# Patient Record
Sex: Male | Born: 1985 | Race: White | Hispanic: No | Marital: Married | Smoking: Never smoker
Health system: Southern US, Community
[De-identification: ages and names within clinical notes are randomized; demographics above are authoritative.]

---

## 2017-09-10 ENCOUNTER — Emergency Department
Admission: EM | Admit: 2017-09-10 | Discharge: 2017-09-10 | Disposition: A | Discharge status: Home / Self Care | Payer: Self-pay | Source: Home / Self Care | Attending: Family Medicine | Admitting: Family Medicine

## 2017-09-10 ENCOUNTER — Encounter: Payer: Self-pay | Admitting: Emergency Medicine

## 2017-09-10 ENCOUNTER — Emergency Department (INDEPENDENT_AMBULATORY_CARE_PROVIDER_SITE_OTHER): Payer: Self-pay

## 2017-09-10 DIAGNOSIS — M5136 Other intervertebral disc degeneration, lumbar region: Secondary | ICD-10-CM

## 2017-09-10 DIAGNOSIS — M545 Low back pain, unspecified: Secondary | ICD-10-CM

## 2017-09-10 DIAGNOSIS — M48061 Spinal stenosis, lumbar region without neurogenic claudication: Secondary | ICD-10-CM

## 2017-09-10 MED ORDER — TRAMADOL HCL 50 MG PO TABS: 50.0000 mg | ORAL_TABLET | Freq: Four times a day (QID) | ORAL | 0 refills | Status: AC | PRN

## 2017-09-10 MED ORDER — MELOXICAM 15 MG PO TABS: 15.0000 mg | ORAL_TABLET | Freq: Every day | ORAL | 0 refills | Status: AC

## 2017-09-10 MED ORDER — METHOCARBAMOL 500 MG PO TABS
500.0000 mg | ORAL_TABLET | Freq: Two times a day (BID) | ORAL | 0 refills | Status: AC
Start: 2017-09-10 — End: ?

## 2017-09-10 NOTE — ED Provider Notes (Signed)
[facility]    CSN: [id number] Arrival date & time: 09/10/17  1335     History   Chief Complaint Chief Complaint  Patient presents with  . Back Pain    HPI Berk Pilot is a 32 y.o. male.   HPI  Quavion Boule is a 32 y.o. male presenting to UC with c/o 5 months lower back pain on Right side. No known injury. Pain has worsened over the last 3 days but more severe today after twisting in a chair.  Pain became severe and his back "locked up."  Pain is moderate in severity at this time, aching and sharp at times. No radiation of pain or numbness into his legs or up his back.  Denies heavy lifting or falls. No urinary symptoms. No hx of kidney stones. He has taken tylenol on occasion with mild relief.    History reviewed. No pertinent past medical history.  There are no active problems to display for this patient.   History reviewed. No pertinent surgical history.     Home Medications    Prior to Admission medications   Medication Sig Start Date End Date Taking? Authorizing Provider  meloxicam (MOBIC) 15 MG tablet Take 1 tablet (15 mg total) by mouth daily. 09/10/17   Lurene Shadow, PA-C  methocarbamol (ROBAXIN) 500 MG tablet Take 1 tablet (500 mg total) by mouth 2 (two) times daily. 09/10/17   Lurene Shadow, PA-C  traMADol (ULTRAM) 50 MG tablet Take 1 tablet (50 mg total) by mouth every 6 (six) hours as needed. 09/10/17   Lurene Shadow, PA-C    Family History History reviewed. No pertinent family history.  Social History Social History   Tobacco Use  . Smoking status: Never Smoker  . Smokeless tobacco: Never Used  Substance Use Topics  . Alcohol use: Not Currently  . Drug use: Not on file     Allergies   Patient has no allergy information on record.   Review of Systems Review of Systems  Gastrointestinal: Negative for abdominal pain, nausea and vomiting.  Genitourinary: Negative for dysuria, flank pain, frequency and hematuria.  Musculoskeletal:  Positive for back pain and myalgias. Negative for neck pain and neck stiffness.  Skin: Negative for color change and rash.  Neurological: Negative for weakness and numbness.     Physical Exam Triage Vital Signs ED Triage Vitals  Enc Vitals Group     BP 09/10/17 1409 (!) 144/89     Pulse Rate 09/10/17 1409 90     Resp --      Temp 09/10/17 1409 98.6 F (37 C)     Temp Source 09/10/17 1409 Oral     SpO2 09/10/17 1409 98 %     Weight 09/10/17 1410 (!) 375 lb (170.1 kg)     Height --      Head Circumference --      Peak Flow --      Pain Score 09/10/17 1410 9     Pain Loc --      Pain Edu? --      Excl. in GC? --    No data found.  Updated Vital Signs BP (!) 144/89 (BP Location: Right Arm)   Pulse 90   Temp 98.6 F (37 C) (Oral)   Wt (!) 375 lb (170.1 kg)   SpO2 98%   Visual Acuity Right Eye Distance:   Left Eye Distance:   Bilateral Distance:    Right Eye Near:   Left  Eye Near:    Bilateral Near:     Physical Exam  Constitutional: He is oriented to person, place, and time. He appears well-developed and well-nourished. No distress.  HENT:  Head: Normocephalic and atraumatic.  Mouth/Throat: Oropharynx is clear and moist.  Eyes: EOM are normal.  Neck: Normal range of motion.  Cardiovascular: Normal rate and regular rhythm.  Pulmonary/Chest: Effort normal and breath sounds normal. No respiratory distress.  Musculoskeletal: Normal range of motion. He exhibits tenderness. He exhibits no edema.  No midline spinal tenderness. Tenderness to Right lower lumbar muscles. Full ROM upper and lower extremities with 5/5 strength. Negative straight leg raise. Normal gait.  Neurological: He is alert and oriented to person, place, and time.  Skin: Skin is warm and dry. He is not diaphoretic.  Psychiatric: He has a normal mood and affect. His behavior is normal.  Nursing note and vitals reviewed.    UC Treatments / Results  Labs (all labs ordered are listed, but only  abnormal results are displayed) Labs Reviewed - No data to display  EKG None  Radiology Dg Lumbar Spine Complete  Result Date: 09/10/2017 CLINICAL DATA:  RIGHT side low back pain for 5 months worse today EXAM: LUMBAR SPINE - COMPLETE 4+ VIEW COMPARISON:  None FINDINGS: 5 non-rib-bearing lumbar vertebra. Partial sacralization of the RIGHT transverse process of L5 with assimilation joint. Slight disc space narrowing L4-L5 with minimal retrolisthesis. Vertebral body and disc space heights otherwise maintained. No fracture, additional subluxation or bone destruction. No spondylolysis. SI joints preserved. IMPRESSION: Degenerative disc disease changes L4-L5 with minimal retrolisthesis. RIGHT assimilation joint L5-S1. No acute abnormalities. Electronically Signed   By: Ulyses SouthwardMark  Boles M.D.   On: 09/10/2017 14:43    Procedures Procedures (including critical care time)  Medications Ordered in UC Medications - No data to display  Initial Impression / Assessment and Plan / UC Course  I have reviewed the triage vital signs and the nursing notes.  Pertinent labs & imaging results that were available during my care of the patient were reviewed by me and considered in my medical decision making (see chart for details).     Discussed imaging with tx. Home care instructions provided below.   Final Clinical Impressions(s) / UC Diagnoses   Final diagnoses:  Acute right-sided low back pain without sciatica  DDD (degenerative disc disease), lumbar     Discharge Instructions      Tramadol is strong pain medication. While taking, do not drink alcohol, drive, or perform any other activities that requires focus while taking these medications.   Meloxicam (Mobic) is an antiinflammatory to help with pain and inflammation.  Do not take ibuprofen, Advil, Aleve, or any other medications that contain NSAIDs while taking meloxicam as this may cause stomach upset or even ulcers if taken in large amounts for an  extended period of time.   Robaxin (methocarbamol) is a muscle relaxer and may cause drowsiness. Do not drink alcohol, drive, or operate heavy machinery while taking.  Please call to schedule a follow up appointment with Sports Medicine if not improving in 1 week, sooner if worsening.     ED Prescriptions    Medication Sig Dispense Auth. Provider   methocarbamol (ROBAXIN) 500 MG tablet Take 1 tablet (500 mg total) by mouth 2 (two) times daily. 20 tablet Doroteo GlassmanPhelps, Korver Graybeal O, PA-C   traMADol (ULTRAM) 50 MG tablet Take 1 tablet (50 mg total) by mouth every 6 (six) hours as needed. 15 tablet Lurene ShadowPhelps, Sumit Branham O, [place]C  meloxicam (MOBIC) 15 MG tablet Take 1 tablet (15 mg total) by mouth daily. 14 tablet Lurene Shadow, [place]     Controlled Substance Prescriptions [place] Controlled Substance Registry consulted? Yes, I have consulted the [place] Controlled Substances Registry for this patient, and feel the risk/benefit ratio today is favorable for proceeding with this prescription for a controlled substance.   Lurene Shadow, [place] 09/10/17 1457

## 2017-09-10 NOTE — Discharge Instructions (Signed)
°  Tramadol is strong pain medication. While taking, do not drink alcohol, drive, or perform any other activities that requires focus while taking these medications.   Meloxicam (Mobic) is an antiinflammatory to help with pain and inflammation.  Do not take ibuprofen, Advil, Aleve, or any other medications that contain NSAIDs while taking meloxicam as this may cause stomach upset or even ulcers if taken in large amounts for an extended period of time.   Robaxin (methocarbamol) is a muscle relaxer and may cause drowsiness. Do not drink alcohol, drive, or operate heavy machinery while taking.  Please call to schedule a follow up appointment with Sports Medicine if not improving in 1 week, sooner if worsening.

## 2017-09-10 NOTE — ED Triage Notes (Signed)
Pt c/o low back pain since February, worse in the last 3 days. Denies injury.

## 2020-02-03 IMAGING — DX DG LUMBAR SPINE COMPLETE 4+V
5 series · 5 of 5 positions shown · non-contrast
Comparison: None

CLINICAL DATA: RIGHT side low back pain for 5 months worse today

EXAM:
LUMBAR SPINE - COMPLETE 4+ VIEW

[l-spine ap]
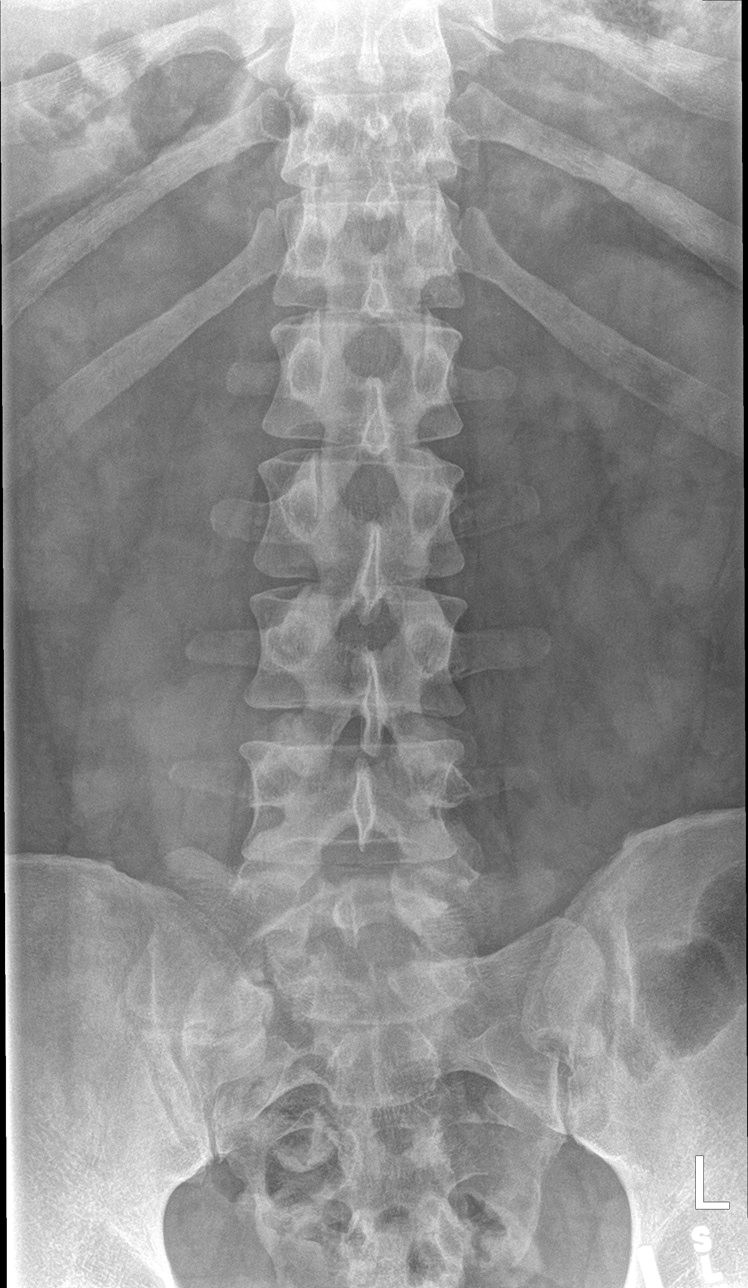

[l-spine obl (1 of 2)]
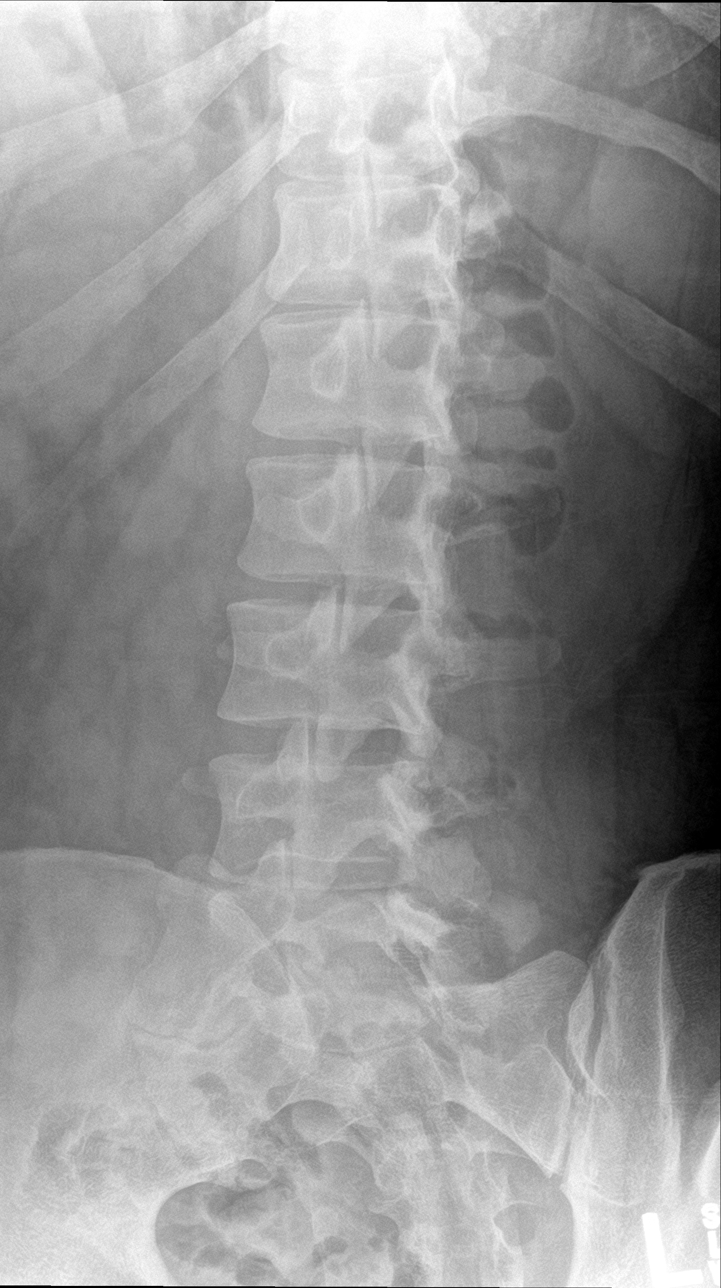

[l-spine obl (2 of 2)]
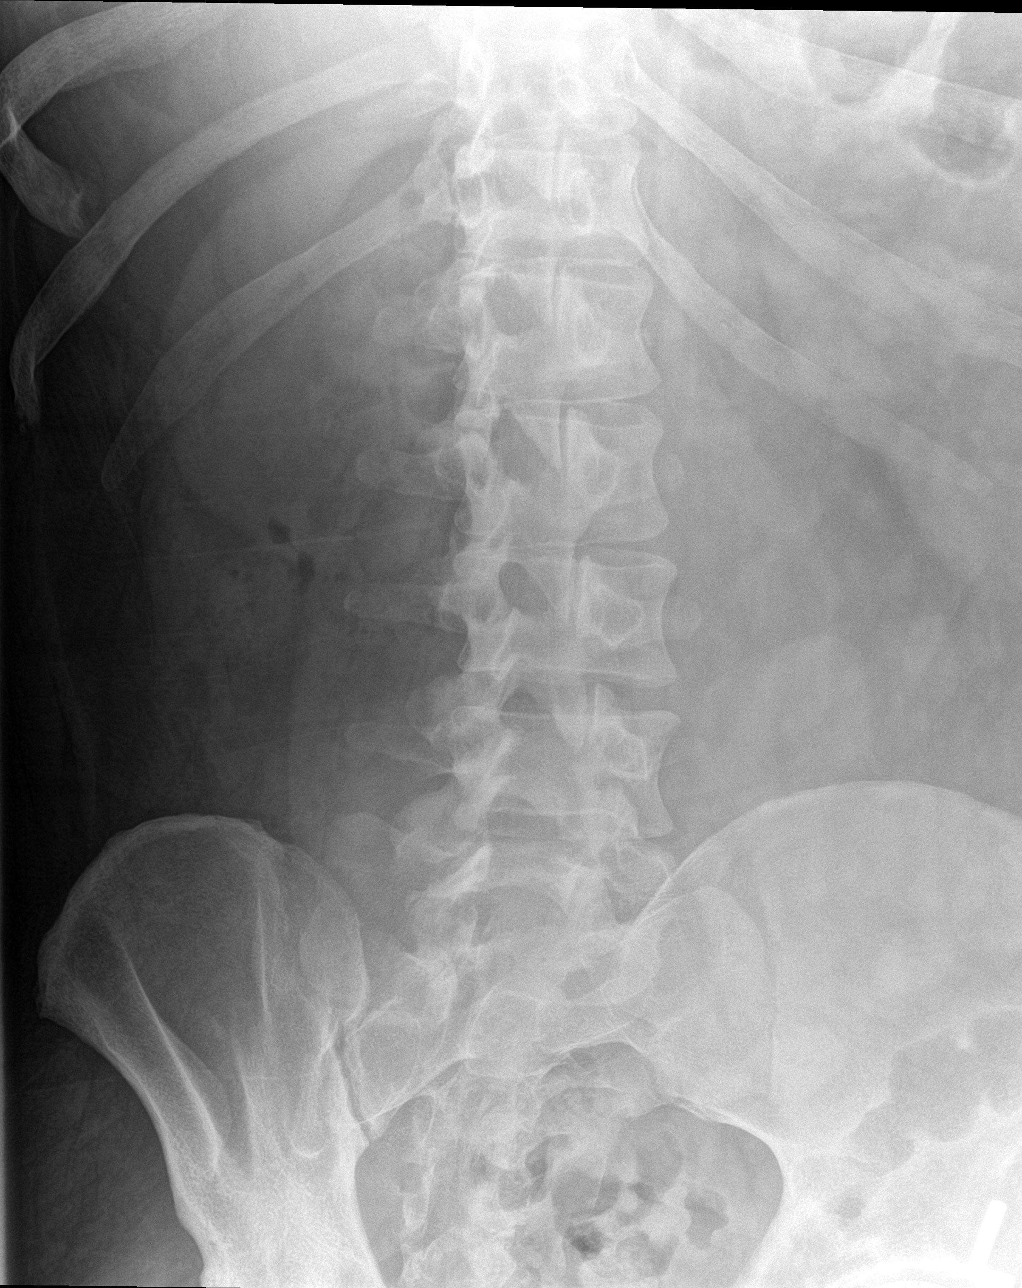

[l-spine lat]
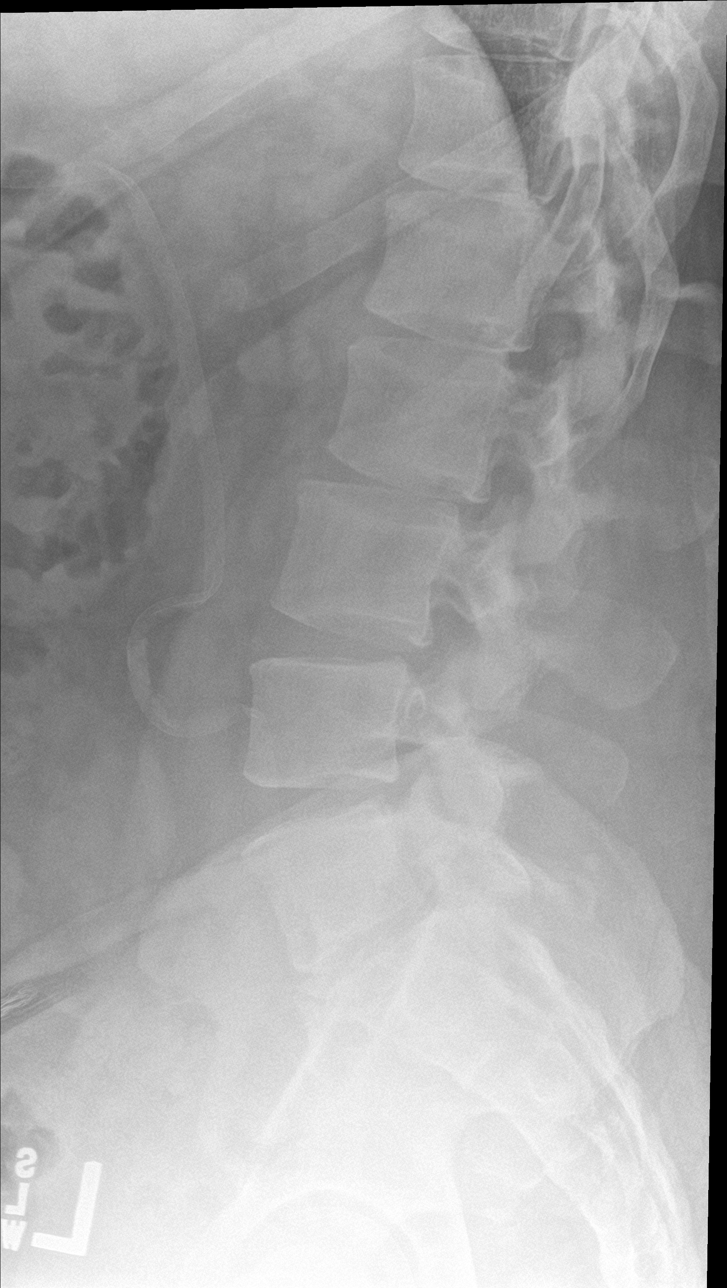

[l-spine spot]
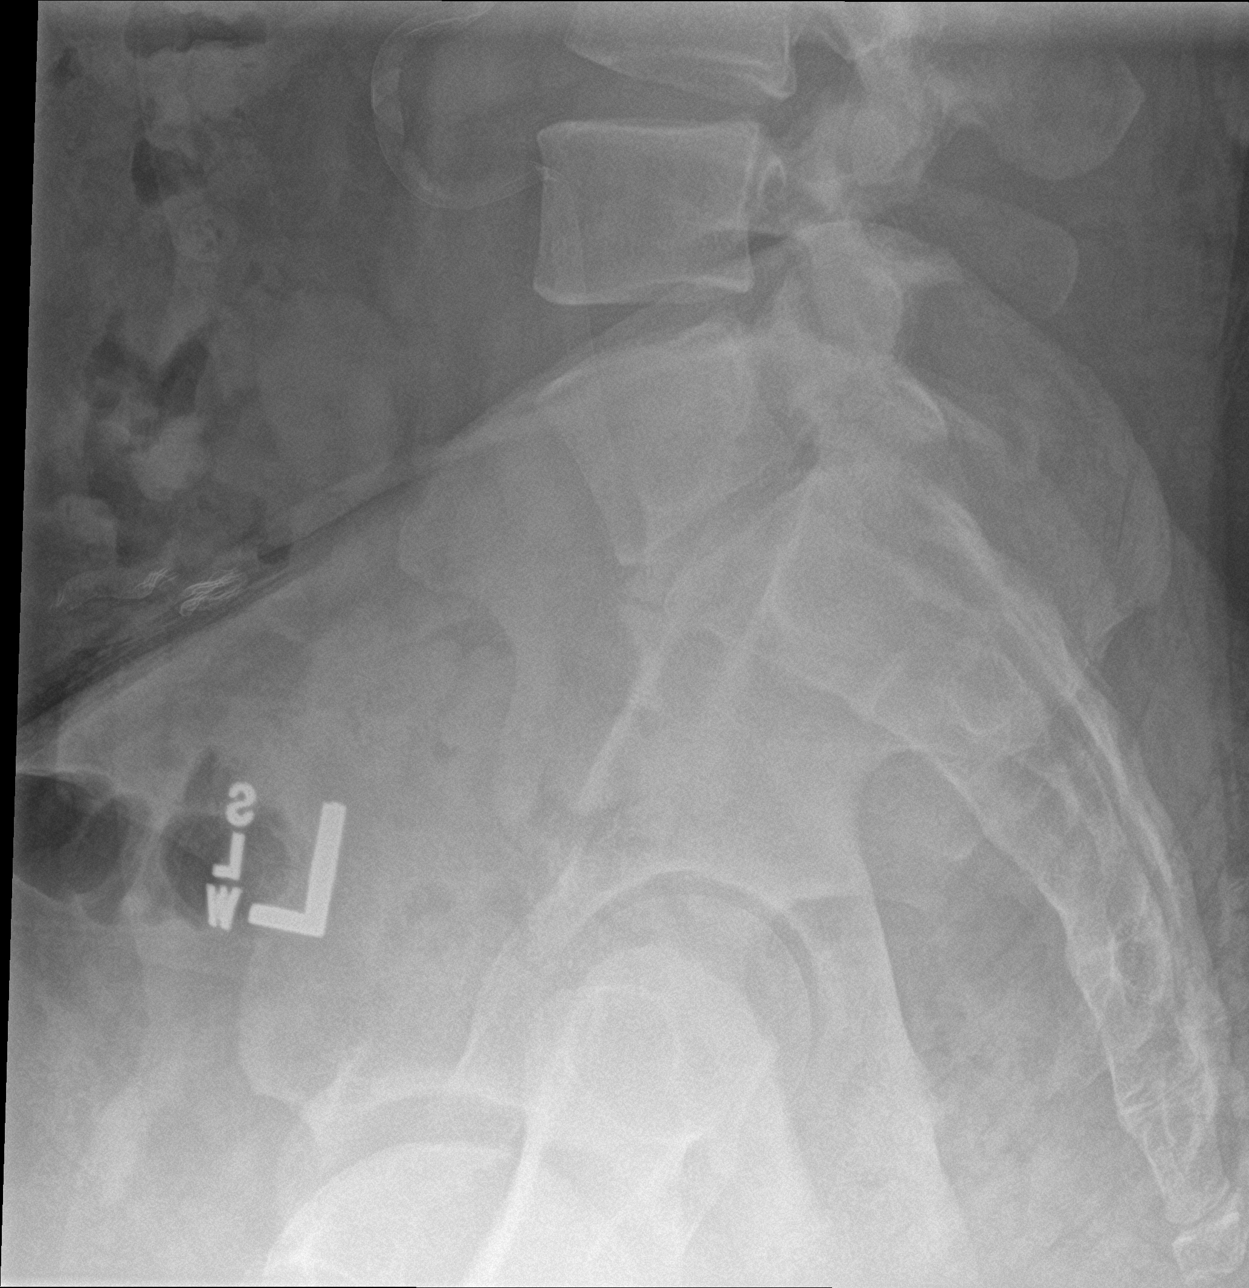

[5 of 5 positions shown; findings below may reference images not displayed]

FINDINGS: 5 non-rib-bearing lumbar vertebra.

Partial sacralization of the RIGHT transverse process of L5 with
assimilation joint.

Slight disc space narrowing L4-L5 with minimal retrolisthesis.

Vertebral body and disc space heights otherwise maintained.

No fracture, additional subluxation or bone destruction.

No spondylolysis.

SI joints preserved.
IMPRESSION: Degenerative disc disease changes L4-L5 with minimal retrolisthesis.

RIGHT assimilation joint L5-S1.

No acute abnormalities.
